# Patient Record
Sex: Male | Born: 1940 | Race: White | Hispanic: No | Marital: Married | Smoking: Never smoker
Health system: Southern US, Community
[De-identification: ages and names within clinical notes are randomized; demographics above are authoritative.]

---

## 2011-06-21 ENCOUNTER — Ambulatory Visit (INDEPENDENT_AMBULATORY_CARE_PROVIDER_SITE_OTHER): Payer: Medicare Other | Admitting: Family Medicine

## 2011-06-21 ENCOUNTER — Encounter: Payer: Self-pay | Admitting: Family Medicine

## 2011-06-21 VITALS — BP 146/79 | HR 76 | Temp 98.0°F | Ht 76.0 in | Wt 210.0 lb

## 2011-06-21 DIAGNOSIS — M25562 Pain in left knee: Secondary | ICD-10-CM

## 2011-06-21 DIAGNOSIS — M25569 Pain in unspecified knee: Secondary | ICD-10-CM

## 2011-06-21 NOTE — Progress Notes (Signed)
  Subjective:    Patient ID: Douglas Lawrence, male    DOB: May 02, 1940, 71 y.o.   MRN: [id number]  PCP: None  HPI 71 yo M here for left knee pain.  Patient denies known injury or trauma. States for past couple weeks has had worsening left knee pain that radiates up thigh at times. Pain worse going up and down ladder and with squatting. Feels some swelling behind knee as well. Tried aspirin with caffeine and tylenol. + popping but no catching, locking, or giving out. Pain bad enough anteriorly to wake him up this morning.  History reviewed. No pertinent past medical history.  No current outpatient prescriptions on file prior to visit.    History reviewed. No pertinent past surgical history.  No Known Allergies  History   Social History  . Marital Status: Married    Spouse Name: N/A    Number of Children: N/A  . Years of Education: N/A   Occupational History  . Not on file.   Social History Main Topics  . Smoking status: Never Smoker   . Smokeless tobacco: Not on file  . Alcohol Use: Not on file  . Drug Use: Not on file  . Sexually Active: Not on file   Other Topics Concern  . Not on file   Social History Narrative  . No narrative on file    Family History  Problem Relation Age of Onset  . Sudden death Neg Hx   . Hypertension Neg Hx   . Hyperlipidemia Neg Hx   . Heart attack Neg Hx   . Diabetes Neg Hx     BP 146/79  Pulse 76  Temp 98 F (36.7 C) (Oral)  Ht 6\' 4"  (1.93 m)  Wt 210 lb (95.255 kg)  BMI 25.56 kg/m2  Review of Systems See HPI above.    Objective:   Physical Exam Gen: NAD  L knee: No gross deformity, ecchymoses, swelling.   Mild medial joint line TTP.  No other TTP about knee. FROM. Negative ant/post drawers. Negative valgus/varus testing. Negative lachmanns. Negative mcmurrays, apleys, patellar apprehension, clarkes. NV intact distally.  R knee: FROM without pain, instability.    Assessment & Plan:  1. 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  Cortisone injection given today.  Discussed other medications to use (tylenol, aleve, glucosamine, capsaicin).  Quad strengthening discussed.  F/u in 1 month for reevaluation.  After informed written consent, patient was seated on exam table. Left knee was prepped with alcohol swab and utilizing anteromedial approach, patient's left knee was injected intraarticularly with 3:1 marcaine: depomedrol. Patient tolerated the procedure well without immediate complications.

## 2011-06-21 NOTE — Patient Instructions (Addendum)
Take tylenol 500mg  1-2 tabs three times a day for pain. Aleve 1-2 tabs twice a day with food Glucosamine sulfate 750mg  twice a day is a supplement that has been shown to help moderate to severe arthritis. Capsaicin topically up to four times a day may also help with pain. Cortisone injections are an option. If cortisone injections do not help, there are different types of shots that may help but they take longer to take effect. It's important that you continue to stay active. Straight leg raises, knee extensions to help with quad strengthening - 3 sets of 10 once a day - add ankle weight if these become too easy. Heat or ice 15 minutes at a time 3-4 times a day as needed to help with pain. Water aerobics and cycling with low resistance are the best two types of exercise for arthritis. Follow up with me in 1 month for reevaluation.

## 2011-06-23 ENCOUNTER — Encounter: Payer: Self-pay | Admitting: Family Medicine

## 2011-06-23 NOTE — Assessment & Plan Note (Signed)
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  Cortisone injection given today.  Discussed other medications to use (tylenol, aleve, glucosamine, capsaicin).  Quad strengthening discussed.  F/u in 1 month for reevaluation.  After informed written consent, patient was seated on exam table. Left knee was prepped with alcohol swab and utilizing anteromedial approach, patient's left knee was injected intraarticularly with 3:1 marcaine: depomedrol. Patient tolerated the procedure well without immediate complications.

## 2011-10-18 DIAGNOSIS — Z23 Encounter for immunization: Secondary | ICD-10-CM | POA: Diagnosis not present

## 2012-05-05 DIAGNOSIS — H2510 Age-related nuclear cataract, unspecified eye: Secondary | ICD-10-CM | POA: Diagnosis not present

## 2012-05-05 DIAGNOSIS — H0230 Blepharochalasis unspecified eye, unspecified eyelid: Secondary | ICD-10-CM | POA: Diagnosis not present

## 2012-10-20 DIAGNOSIS — Z23 Encounter for immunization: Secondary | ICD-10-CM | POA: Diagnosis not present

## 2013-07-30 DIAGNOSIS — H18419 Arcus senilis, unspecified eye: Secondary | ICD-10-CM | POA: Diagnosis not present

## 2013-07-30 DIAGNOSIS — H25019 Cortical age-related cataract, unspecified eye: Secondary | ICD-10-CM | POA: Diagnosis not present

## 2013-07-30 DIAGNOSIS — H25049 Posterior subcapsular polar age-related cataract, unspecified eye: Secondary | ICD-10-CM | POA: Diagnosis not present

## 2013-07-30 DIAGNOSIS — H02839 Dermatochalasis of unspecified eye, unspecified eyelid: Secondary | ICD-10-CM | POA: Diagnosis not present

## 2013-07-30 DIAGNOSIS — H2510 Age-related nuclear cataract, unspecified eye: Secondary | ICD-10-CM | POA: Diagnosis not present

## 2013-08-19 ENCOUNTER — Encounter: Payer: Self-pay | Admitting: Family Medicine

## 2013-08-19 ENCOUNTER — Ambulatory Visit (INDEPENDENT_AMBULATORY_CARE_PROVIDER_SITE_OTHER): Payer: Medicare Other | Admitting: Family Medicine

## 2013-08-19 VITALS — BP 160/94 | HR 90 | Ht 76.0 in | Wt 210.0 lb

## 2013-08-19 DIAGNOSIS — M25569 Pain in unspecified knee: Secondary | ICD-10-CM | POA: Diagnosis not present

## 2013-08-19 DIAGNOSIS — M25561 Pain in right knee: Secondary | ICD-10-CM

## 2013-08-19 MED ORDER — METHYLPREDNISOLONE ACETATE 40 MG/ML IJ SUSP
40.0000 mg | Freq: Once | INTRAMUSCULAR | Status: AC
  Administered 2013-08-19: 40 mg via INTRA_ARTICULAR

## 2013-08-19 NOTE — Patient Instructions (Signed)
You have arthritis.  Below are the different things you can do for this: Take tylenol 500mg  1-2 tabs three times a day for pain. Aleve 1-2 tabs twice a day with food Glucosamine sulfate 750mg  twice a day is a supplement that may help. Capsaicin topically up to four times a day may also help with pain. Cortisone injections are an option - you were given one of these today. If cortisone injections do not help, there are different types of shots that may help but they take longer to take effect. It's important that you continue to stay active. Straight leg raises, knee extensions 3 sets of 10 once a day (add ankle weight if these become too easy). Consider physical therapy to strengthen muscles around the joint that hurts to take pressure off of the joint itself. Shoe inserts with good arch support may be helpful. Heat or ice 15 minutes at a time 3-4 times a day as needed to help with pain. Water aerobics and cycling with low resistance are the best two types of exercise for arthritis. Follow up with me as needed.

## 2013-08-20 ENCOUNTER — Encounter: Payer: Self-pay | Admitting: Family Medicine

## 2013-08-20 DIAGNOSIS — M25561 Pain in right knee: Secondary | ICD-10-CM | POA: Insufficient documentation

## 2013-08-20 NOTE — Assessment & Plan Note (Signed)
2/2 arthritis.  Discussed tylenol, nsaids, glucosamine, capsaicin.  Injection given today.  Home exercises reviewed.  Consider PT in future.  F/u prn.  After informed written consent, patient was seated on exam table. Right knee was prepped with alcohol swab and utilizing anteromedial approach, patient's right knee was injected intraarticularly with 3:1 marcaine: depomedrol. Patient tolerated the procedure well without immediate complications.

## 2013-08-20 NOTE — Progress Notes (Signed)
Patient ID: Douglas Lawrence, male   DOB: 1940-02-10, 73 y.o.   MRN: [id number]  PCP: No primary provider on file.  Subjective:   HPI: Patient is a 73 y.o. male here for right knee pain.  Patient denies known injury He states over past 2 months pain on medial aspect of right knee has been worsening. Last visit 2 years ago was for left knee - did well following injection, no pain. No swelling, bruising. Taking aleve and occasional tylenol. By end of day pain is 7/10. No catching, locking, giving out.   History reviewed. No pertinent past medical history.  No current outpatient prescriptions on file prior to visit.   No current facility-administered medications on file prior to visit.    History reviewed. No pertinent past surgical history.  No Known Allergies  History   Social History  . Marital Status: Married    Spouse Name: N/A    Number of Children: N/A  . Years of Education: N/A   Occupational History  . Not on file.   Social History Main Topics  . Smoking status: Never Smoker   . Smokeless tobacco: Not on file  . Alcohol Use: Not on file  . Drug Use: Not on file  . Sexual Activity: Not on file   Other Topics Concern  . Not on file   Social History Narrative  . No narrative on file    Family History  Problem Relation Age of Onset  . Sudden death Neg Hx   . Hypertension Neg Hx   . Hyperlipidemia Neg Hx   . Heart attack Neg Hx   . Diabetes Neg Hx     BP 160/94  Pulse 90  Ht 6\' 4"  (1.93 m)  Wt 210 lb (95.255 kg)  BMI 25.57 kg/m2  Review of Systems: See HPI above.    Objective:  Physical Exam:  Gen: NAD  Right knee: No gross deformity, ecchymoses, swelling.  Crepitation. TTP medial joint line.  No other tenderness. FROM. Negative ant/post drawers. Negative valgus/varus testing. Negative lachmanns. Negative mcmurrays, apleys, patellar apprehension, clarkes. NV intact distally.    Assessment & Plan:  1. Right knee pain - 2/2 arthritis.   Discussed tylenol, nsaids, glucosamine, capsaicin.  Injection given today.  Home exercises reviewed.  Consider PT in future.  F/u prn.  After informed written consent, patient was seated on exam table. Right knee was prepped with alcohol swab and utilizing anteromedial approach, patient's right knee was injected intraarticularly with 3:1 marcaine: depomedrol. Patient tolerated the procedure well without immediate complications.

## 2013-09-10 DIAGNOSIS — H269 Unspecified cataract: Secondary | ICD-10-CM | POA: Insufficient documentation

## 2013-09-16 DIAGNOSIS — Z791 Long term (current) use of non-steroidal anti-inflammatories (NSAID): Secondary | ICD-10-CM | POA: Diagnosis not present

## 2013-09-16 DIAGNOSIS — IMO0002 Reserved for concepts with insufficient information to code with codable children: Secondary | ICD-10-CM | POA: Diagnosis not present

## 2013-09-16 DIAGNOSIS — H269 Unspecified cataract: Secondary | ICD-10-CM | POA: Diagnosis not present

## 2013-09-16 DIAGNOSIS — H2510 Age-related nuclear cataract, unspecified eye: Secondary | ICD-10-CM | POA: Diagnosis not present

## 2013-09-16 DIAGNOSIS — I10 Essential (primary) hypertension: Secondary | ICD-10-CM | POA: Diagnosis not present

## 2013-09-17 DIAGNOSIS — H2510 Age-related nuclear cataract, unspecified eye: Secondary | ICD-10-CM | POA: Diagnosis not present

## 2013-10-01 DIAGNOSIS — H269 Unspecified cataract: Secondary | ICD-10-CM | POA: Insufficient documentation

## 2013-10-06 DIAGNOSIS — H6120 Impacted cerumen, unspecified ear: Secondary | ICD-10-CM | POA: Diagnosis not present

## 2013-10-06 DIAGNOSIS — H903 Sensorineural hearing loss, bilateral: Secondary | ICD-10-CM | POA: Diagnosis not present

## 2013-10-07 DIAGNOSIS — H2589 Other age-related cataract: Secondary | ICD-10-CM | POA: Diagnosis not present

## 2013-10-07 DIAGNOSIS — H02839 Dermatochalasis of unspecified eye, unspecified eyelid: Secondary | ICD-10-CM | POA: Diagnosis not present

## 2013-10-07 DIAGNOSIS — Z98811 Dental restoration status: Secondary | ICD-10-CM | POA: Diagnosis not present

## 2013-10-07 DIAGNOSIS — I10 Essential (primary) hypertension: Secondary | ICD-10-CM | POA: Diagnosis not present

## 2013-10-07 DIAGNOSIS — H18419 Arcus senilis, unspecified eye: Secondary | ICD-10-CM | POA: Diagnosis not present

## 2013-10-07 DIAGNOSIS — H2510 Age-related nuclear cataract, unspecified eye: Secondary | ICD-10-CM | POA: Diagnosis not present

## 2013-10-16 DIAGNOSIS — Z23 Encounter for immunization: Secondary | ICD-10-CM | POA: Diagnosis not present

## 2014-05-20 DIAGNOSIS — H02839 Dermatochalasis of unspecified eye, unspecified eyelid: Secondary | ICD-10-CM | POA: Diagnosis not present

## 2014-05-20 DIAGNOSIS — H18411 Arcus senilis, right eye: Secondary | ICD-10-CM | POA: Diagnosis not present

## 2014-05-20 DIAGNOSIS — Z961 Presence of intraocular lens: Secondary | ICD-10-CM | POA: Diagnosis not present

## 2014-09-28 ENCOUNTER — Ambulatory Visit (INDEPENDENT_AMBULATORY_CARE_PROVIDER_SITE_OTHER): Payer: Medicare Other | Admitting: Family Medicine

## 2014-09-28 ENCOUNTER — Encounter: Payer: Self-pay | Admitting: Family Medicine

## 2014-09-28 ENCOUNTER — Encounter (INDEPENDENT_AMBULATORY_CARE_PROVIDER_SITE_OTHER): Payer: Self-pay

## 2014-09-28 VITALS — BP 189/77 | HR 85 | Ht 75.0 in | Wt 220.0 lb

## 2014-09-28 DIAGNOSIS — M25569 Pain in unspecified knee: Secondary | ICD-10-CM | POA: Diagnosis present

## 2014-09-28 DIAGNOSIS — M25551 Pain in right hip: Secondary | ICD-10-CM | POA: Diagnosis not present

## 2014-09-28 MED ORDER — PREDNISONE 10 MG PO TABS: ORAL_TABLET | ORAL | Status: DC

## 2014-09-28 NOTE — Patient Instructions (Signed)
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 Pick 2-3 stretches where you feel the pull in the area of pain - do 3 of these and hold for 20-30 seconds at least once a day Standing hip rotations and side leg raises 3 sets of 10 once a day Add ankle weight if these become too easy. Prednisone dose pack x 6 days. Consider tennis ball to massage area when sitting Consider physical therapy Follow up with me in 6 weeks.

## 2014-09-30 DIAGNOSIS — M5410 Radiculopathy, site unspecified: Secondary | ICD-10-CM | POA: Insufficient documentation

## 2014-09-30 NOTE — Assessment & Plan Note (Signed)
consistent with sciatica, piriformis strain/spasms.  Shown home exercises and stretches to do daily.  Prednisone dose pack (was only taking very low dose) for 6 days.  Consider physical therapy if not improving.  F/u in 6 weeks.
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
## 2014-10-28 ENCOUNTER — Ambulatory Visit (INDEPENDENT_AMBULATORY_CARE_PROVIDER_SITE_OTHER): Payer: Medicare Other | Admitting: Family Medicine

## 2014-10-28 ENCOUNTER — Ambulatory Visit (HOSPITAL_BASED_OUTPATIENT_CLINIC_OR_DEPARTMENT_OTHER)
Admission: RE | Admit: 2014-10-28 | Discharge: 2014-10-28 | Disposition: A | Discharge status: Home / Self Care | Payer: Medicare Other | Source: Ambulatory Visit | Attending: Family Medicine | Admitting: Family Medicine

## 2014-10-28 ENCOUNTER — Encounter (INDEPENDENT_AMBULATORY_CARE_PROVIDER_SITE_OTHER): Payer: Self-pay

## 2014-10-28 DIAGNOSIS — M545 Low back pain: Secondary | ICD-10-CM | POA: Diagnosis not present

## 2014-10-28 DIAGNOSIS — M25551 Pain in right hip: Secondary | ICD-10-CM | POA: Diagnosis not present

## 2014-10-28 DIAGNOSIS — M4186 Other forms of scoliosis, lumbar region: Secondary | ICD-10-CM | POA: Diagnosis not present

## 2014-10-28 DIAGNOSIS — M47896 Other spondylosis, lumbar region: Secondary | ICD-10-CM | POA: Insufficient documentation

## 2014-10-28 DIAGNOSIS — M5441 Lumbago with sciatica, right side: Secondary | ICD-10-CM | POA: Diagnosis not present

## 2014-10-28 DIAGNOSIS — M5410 Radiculopathy, site unspecified: Secondary | ICD-10-CM | POA: Diagnosis not present

## 2014-10-28 NOTE — Patient Instructions (Signed)
Get x-rays as you leave today. We will set up an MRI of your lumbar spine assuming the x-rays only show arthritis as expected.

## 2014-11-01 ENCOUNTER — Other Ambulatory Visit: Payer: Self-pay | Admitting: Family Medicine

## 2014-11-01 DIAGNOSIS — Z1389 Encounter for screening for other disorder: Secondary | ICD-10-CM

## 2014-11-01 NOTE — Assessment & Plan Note (Signed)
concerning for radiculopathy from disc bulge/herniation.  Radiographs with mild DDD only.  Will go ahead with MRI to further assess as not improving with prednisone, home exercises.
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
## 2014-11-02 ENCOUNTER — Ambulatory Visit (HOSPITAL_BASED_OUTPATIENT_CLINIC_OR_DEPARTMENT_OTHER)
Admission: RE | Admit: 2014-11-02 | Discharge: 2014-11-02 | Disposition: A | Discharge status: Home / Self Care | Payer: Medicare Other | Source: Ambulatory Visit | Attending: Family Medicine | Admitting: Family Medicine

## 2014-11-02 DIAGNOSIS — Z23 Encounter for immunization: Secondary | ICD-10-CM | POA: Diagnosis not present

## 2014-11-02 DIAGNOSIS — M795 Residual foreign body in soft tissue: Secondary | ICD-10-CM | POA: Diagnosis not present

## 2014-11-02 DIAGNOSIS — Z1389 Encounter for screening for other disorder: Secondary | ICD-10-CM | POA: Diagnosis not present

## 2014-11-06 ENCOUNTER — Ambulatory Visit (HOSPITAL_BASED_OUTPATIENT_CLINIC_OR_DEPARTMENT_OTHER)
Admission: RE | Admit: 2014-11-06 | Discharge: 2014-11-06 | Disposition: A | Discharge status: Home / Self Care | Payer: Medicare Other | Source: Ambulatory Visit | Attending: Family Medicine | Admitting: Family Medicine

## 2014-11-06 DIAGNOSIS — M5126 Other intervertebral disc displacement, lumbar region: Secondary | ICD-10-CM | POA: Insufficient documentation

## 2014-11-06 DIAGNOSIS — M4806 Spinal stenosis, lumbar region: Secondary | ICD-10-CM | POA: Diagnosis not present

## 2014-11-06 DIAGNOSIS — M5441 Lumbago with sciatica, right side: Secondary | ICD-10-CM

## 2014-11-06 DIAGNOSIS — M545 Low back pain: Secondary | ICD-10-CM | POA: Diagnosis present

## 2014-11-06 DIAGNOSIS — M5431 Sciatica, right side: Secondary | ICD-10-CM | POA: Insufficient documentation

## 2014-11-06 DIAGNOSIS — M5127 Other intervertebral disc displacement, lumbosacral region: Secondary | ICD-10-CM | POA: Diagnosis not present

## 2014-11-06 DIAGNOSIS — M1288 Other specific arthropathies, not elsewhere classified, other specified site: Secondary | ICD-10-CM | POA: Insufficient documentation

## 2014-11-06 DIAGNOSIS — M7138 Other bursal cyst, other site: Secondary | ICD-10-CM | POA: Diagnosis not present

## 2014-11-10 ENCOUNTER — Ambulatory Visit: Payer: No Typology Code available for payment source | Admitting: Family Medicine

## 2014-11-10 ENCOUNTER — Other Ambulatory Visit: Payer: Self-pay | Admitting: Family Medicine

## 2014-11-10 DIAGNOSIS — M545 Low back pain, unspecified: Secondary | ICD-10-CM

## 2014-11-10 DIAGNOSIS — G8929 Other chronic pain: Secondary | ICD-10-CM

## 2014-11-10 DIAGNOSIS — M5431 Sciatica, right side: Secondary | ICD-10-CM

## 2014-11-17 ENCOUNTER — Other Ambulatory Visit: Payer: No Typology Code available for payment source

## 2014-11-18 ENCOUNTER — Other Ambulatory Visit: Payer: Self-pay | Admitting: Family Medicine

## 2014-11-18 ENCOUNTER — Ambulatory Visit
Admission: RE | Admit: 2014-11-18 | Discharge: 2014-11-18 | Disposition: A | Discharge status: Home / Self Care | Payer: Medicare Other | Source: Ambulatory Visit | Attending: Family Medicine | Admitting: Family Medicine

## 2014-11-18 DIAGNOSIS — M545 Low back pain: Principal | ICD-10-CM

## 2014-11-18 DIAGNOSIS — M5431 Sciatica, right side: Secondary | ICD-10-CM

## 2014-11-18 DIAGNOSIS — G8929 Other chronic pain: Secondary | ICD-10-CM

## 2014-11-18 DIAGNOSIS — M7138 Other bursal cyst, other site: Secondary | ICD-10-CM | POA: Diagnosis not present

## 2014-11-18 MED ORDER — IOHEXOL 180 MG/ML  SOLN
1.0000 mL | Freq: Once | INTRAMUSCULAR | Status: DC | PRN
  Administered 2014-11-18: 1 mL via EPIDURAL

## 2014-11-18 MED ORDER — METHYLPREDNISOLONE ACETATE 40 MG/ML INJ SUSP (RADIOLOG
120.0000 mg | Freq: Once | INTRAMUSCULAR | Status: AC
  Administered 2014-11-18: 120 mg via EPIDURAL

## 2014-11-18 NOTE — Discharge Instructions (Signed)

## 2015-05-05 ENCOUNTER — Ambulatory Visit (INDEPENDENT_AMBULATORY_CARE_PROVIDER_SITE_OTHER): Payer: Medicare Other | Admitting: Family Medicine

## 2015-05-05 ENCOUNTER — Encounter: Payer: Self-pay | Admitting: Family Medicine

## 2015-05-05 VITALS — BP 136/78 | HR 84 | Temp 97.8°F | Ht 76.0 in | Wt 215.0 lb

## 2015-05-05 DIAGNOSIS — M10072 Idiopathic gout, left ankle and foot: Secondary | ICD-10-CM

## 2015-05-05 DIAGNOSIS — M109 Gout, unspecified: Secondary | ICD-10-CM

## 2015-05-05 MED ORDER — PREDNISONE 10 MG PO TABS: ORAL_TABLET | ORAL | Status: DC

## 2015-05-05 MED ORDER — PREDNISONE 10 MG PO TABS: ORAL_TABLET | ORAL | Status: AC

## 2015-05-05 MED ORDER — COLCHICINE 0.6 MG PO TABS: 0.6000 mg | ORAL_TABLET | Freq: Two times a day (BID) | ORAL | Status: DC

## 2015-05-05 MED ORDER — COLCHICINE 0.6 MG PO TABS: 0.6000 mg | ORAL_TABLET | Freq: Two times a day (BID) | ORAL | Status: AC

## 2015-05-05 MED FILL — predniSONE 10 MG TABS: 10 | 12 days supply | Qty: 42 | Fill #0

## 2015-05-05 NOTE — Patient Instructions (Signed)
You have an acute flare of gout. Take prednisone as directed for 12 days. Take colchicine twice a day until pain has resolved. Icing 15 minutes at a time 3-4 times a day. Elevate as needed for swelling. Ok to walk with this - if you're struggling consider a boot or postop shoe. Follow up with me in 2 weeks. Can consider a joint injection also though typically don't need this.

## 2015-05-06 DIAGNOSIS — M109 Gout, unspecified: Secondary | ICD-10-CM | POA: Insufficient documentation

## 2015-05-06 NOTE — Assessment & Plan Note (Signed)
classic presentation at 1st MTP.  No prior issues.  Afebrile. Brief MSK u/s confirmed effusion and gouty crystals visualized in this joint.  Discussed options - he would like to try oral prednisone dose pack (will do extended course).  Colchicine prescribed but too expensive.  Icing, elevation.  Ambulation as tolerated.  Consider intraarticular injection if not improving over next 1-2 weeks.  F/u in 2 weeks.

## 2015-05-06 NOTE — Progress Notes (Signed)
PCP: No primary care provider on file.  Subjective:   HPI: Patient is a 75 y.o. male here for left great toe pain.  Patient denies known injury or trauma. He reports increased pain, swelling, redness at base of left great toe for 2 weeks. Took some of his wife's prednisone which helped some but pain returned. Pain level now 6-7/10 Pain can radiate up to calf. Worse with any movement.  No past medical history on file.  Current Outpatient Prescriptions on File Prior to Visit  Medication Sig Dispense Refill  . BESIVANCE 0.6 % SUSP     . DUREZOL 0.05 % EMUL     . ILEVRO 0.3 % SUSP      No current facility-administered medications on file prior to visit.    No past surgical history on file.  No Known Allergies  Social History   Social History  . Marital Status: Married    Spouse Name: N/A  . Number of Children: N/A  . Years of Education: N/A   Occupational History  . Not on file.   Social History Main Topics  . Smoking status: Never Smoker   . Smokeless tobacco: Not on file  . Alcohol Use: Not on file  . Drug Use: Not on file  . Sexual Activity: Not on file   Other Topics Concern  . Not on file   Social History Narrative    Family History  Problem Relation Age of Onset  . Sudden death Neg Hx   . Hypertension Neg Hx   . Hyperlipidemia Neg Hx   . Heart attack Neg Hx   . Diabetes Neg Hx     BP 136/78 mmHg  Pulse 84  Temp(Src) 97.8 F (36.6 C) (Oral)  Ht 6\' 4"  (1.93 m)  Wt 215 lb (97.523 kg)  BMI 26.18 kg/m2  Review of Systems: See HPI above.    Objective:  Physical Exam:  Gen: NAD, comfortable in exam room  Left foot/ankle: Swelling, warmth, redness over 1st MTP.  No other deformity.  No bruising. Decreased motion of 1st MTP - very painful with any movement. TTP 1st MTP. Negative ant drawer and talar tilt.   Negative syndesmotic compression. Thompsons test negative. NV intact distally.    Assessment & Plan:  1. Acute gout flare - classic  presentation at 1st MTP.  No prior issues.  Afebrile. Brief MSK u/s confirmed effusion and gouty crystals visualized in this joint.  Discussed options - he would like to try oral prednisone dose pack (will do extended course).  Colchicine prescribed but too expensive.  Icing, elevation.  Ambulation as tolerated.  Consider intraarticular injection if not improving over next 1-2 weeks.  F/u in 2 weeks.

## 2015-05-23 ENCOUNTER — Ambulatory Visit (INDEPENDENT_AMBULATORY_CARE_PROVIDER_SITE_OTHER): Payer: Medicare Other | Admitting: Family Medicine

## 2015-05-23 ENCOUNTER — Encounter: Payer: Self-pay | Admitting: Family Medicine

## 2015-05-23 VITALS — BP 146/76 | HR 73 | Ht 76.0 in | Wt 220.0 lb

## 2015-05-23 DIAGNOSIS — M10072 Idiopathic gout, left ankle and foot: Secondary | ICD-10-CM

## 2015-05-23 DIAGNOSIS — M109 Gout, unspecified: Secondary | ICD-10-CM

## 2015-05-24 NOTE — Progress Notes (Signed)
PCP: No PCP Per Patient  Subjective:   HPI: Patient is a 75 y.o. male here for left great toe pain.  4/27: Patient denies known injury or trauma. He reports increased pain, swelling, redness at base of left great toe for 2 weeks. Took some of his wife's prednisone which helped some but pain returned. Pain level now 6-7/10 Pain can radiate up to calf. Worse with any movement.  5/15: Patient reports he feels much better. Has finished the prednisone, still with a little stiffness but no pain. Pain 0/10. No skin changes. Redness has resolved. No fever.  No past medical history on file.  Current Outpatient Prescriptions on File Prior to Visit  Medication Sig Dispense Refill  . BESIVANCE 0.6 % SUSP     . colchicine 0.6 MG tablet Take 1 tablet (0.6 mg total) by mouth 2 (two) times daily. 60 tablet 1  . DUREZOL 0.05 % EMUL     . ILEVRO 0.3 % SUSP     . predniSONE (DELTASONE) 10 MG tablet 6 tabs po days 1-2, 5 tabs po days 3-4, 4 tabs po days 5-6, 3 tabs po days 7-8, 2 tabs po days 9-10, 1 tab po days 11-12 42 tablet 0   No current facility-administered medications on file prior to visit.    No past surgical history on file.  No Known Allergies  Social History   Social History  . Marital Status: Married    Spouse Name: N/A  . Number of Children: N/A  . Years of Education: N/A   Occupational History  . Not on file.   Social History Main Topics  . Smoking status: Never Smoker   . Smokeless tobacco: Not on file  . Alcohol Use: Not on file  . Drug Use: Not on file  . Sexual Activity: Not on file   Other Topics Concern  . Not on file   Social History Narrative    Family History  Problem Relation Age of Onset  . Sudden death Neg Hx   . Hypertension Neg Hx   . Hyperlipidemia Neg Hx   . Heart attack Neg Hx   . Diabetes Neg Hx     BP 146/76 mmHg  Pulse 73  Ht 6\' 4"  (1.93 m)  Wt 220 lb (99.791 kg)  BMI 26.79 kg/m2  Review of Systems: See HPI above.     Objective:  Physical Exam:  Gen: NAD, comfortable in exam room  Left foot/ankle: No swelling, warmth, redness over 1st MTP now.  No deformity.  No bruising. Mild decreased motion at 1st MTP but no pain. No TTP 1st MTP. Negative ant drawer and talar tilt.   Negative syndesmotic compression. Thompsons test negative. NV intact distally.    Assessment & Plan:  1. Acute gout flare - classic presentation at 1st MTP.  Clinically improved following prednisone dose pack.  Reports this is only time he has had this - will not start preventative medicine as such or check uric acid level.  F/u with us prn.

## 2015-05-24 NOTE — Assessment & Plan Note (Signed)
classic presentation at 1st MTP.  Clinically improved following prednisone dose pack.  Reports this is only time he has had this - will not start preventative medicine as such or check uric acid level.  F/u with us prn.

## 2015-11-15 DIAGNOSIS — Z23 Encounter for immunization: Secondary | ICD-10-CM | POA: Diagnosis not present

## 2016-07-25 IMAGING — MR MR LUMBAR SPINE W/O CM
5 series · 31 of 48 positions shown · non-contrast
Comparison: Lumbar radiographs 10/28/2014.

CLINICAL DATA: Low back pain.  RIGHT-sided sciatica.

EXAM:
MRI LUMBAR SPINE WITHOUT CONTRAST
TECHNIQUE: Multiplanar, multisequence MR imaging of the lumbar spine was
performed. No intravenous contrast was administered.

[Series 2: T1 · sagittal · 4.0mm · 0.51mm/px · 6 of 17 slices shown (1 of 2)]
[im 1/17]
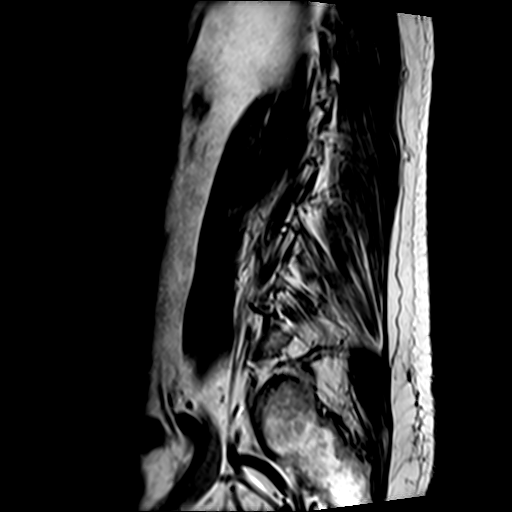
[im 4/17]
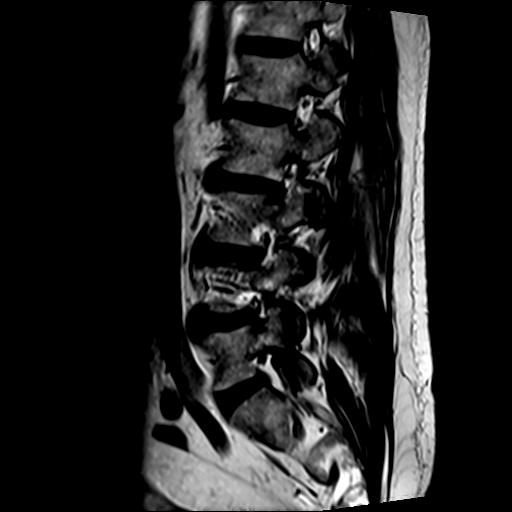
[im 7/17]
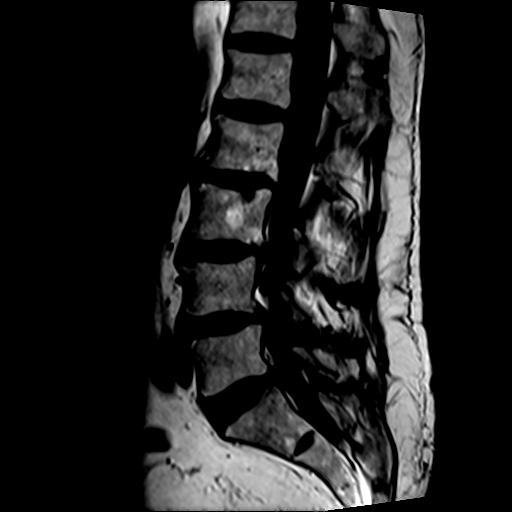
[im 10/17]
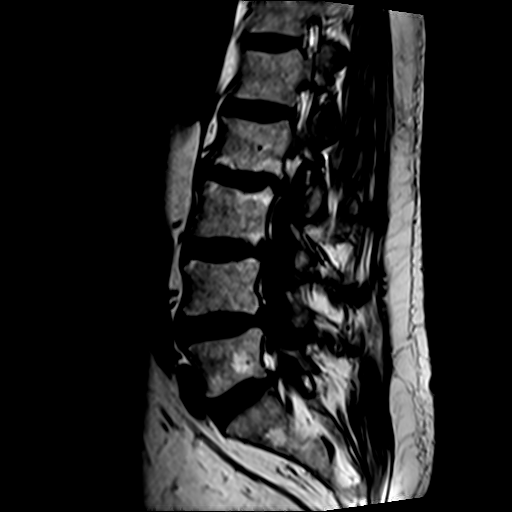
[im 13/17]
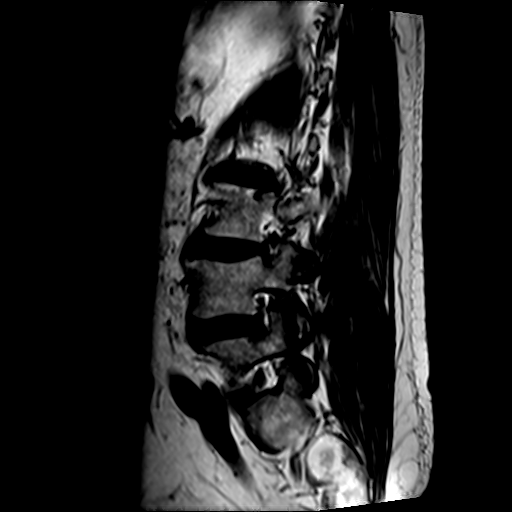
[im 17/17]
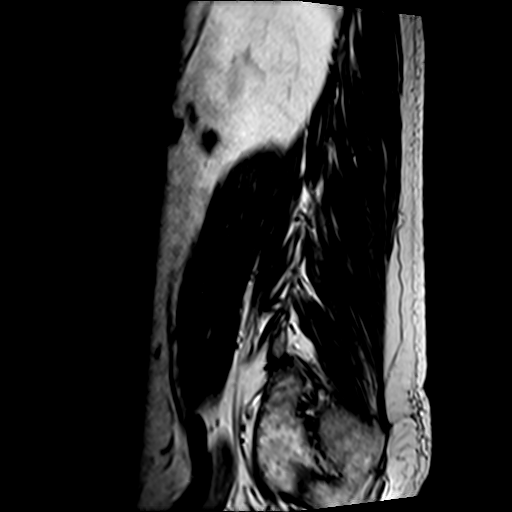

[Series 3: T2 · sagittal · 4.0mm · 0.81mm/px · 7 of 17 slices shown (1 of 2)]
[im 1/17]
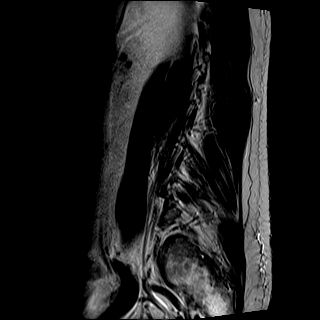
[im 3/17]
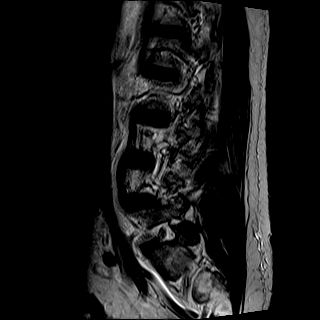
[im 6/17]
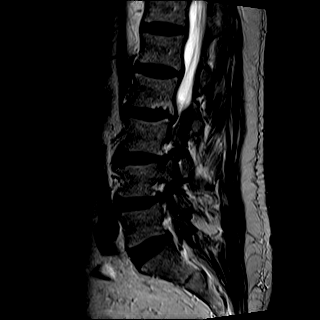
[im 9/17]
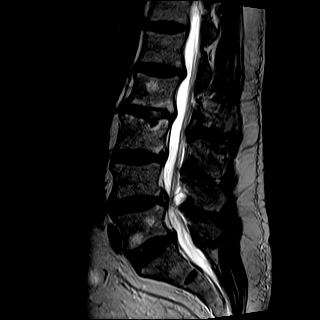
[im 11/17]
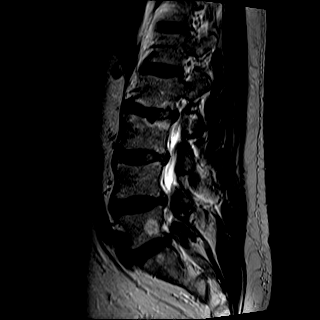
[im 14/17]
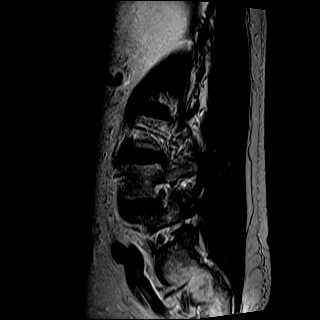
[im 17/17]
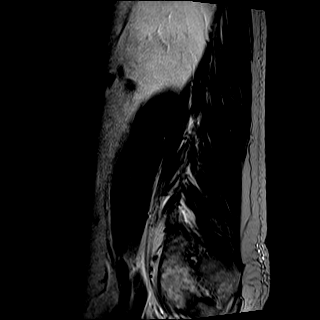

[Series 4: STIR · sagittal · 4.0mm · 1.02mm/px · 2 of 17 slices shown]
[im 1/17]
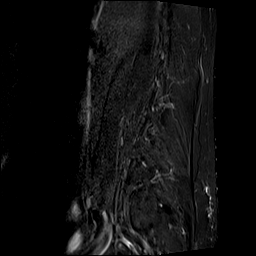
[im 3/17]
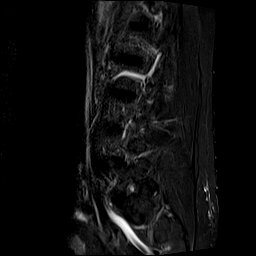

[Series 5: T2 · axial · 4.0mm · 0.39mm/px · z∈[-46,+158]mm · 8 of 36 slices shown (2 of 2)]
[im 1/36]
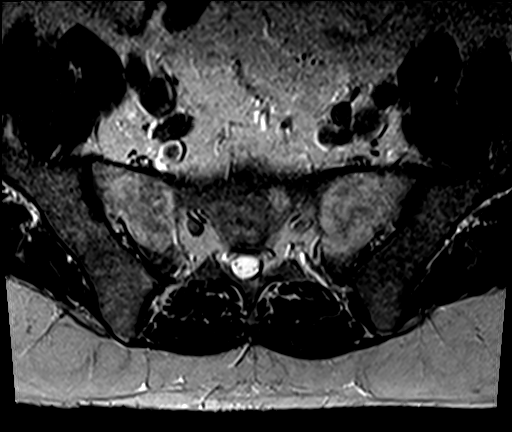
[im 6/36]
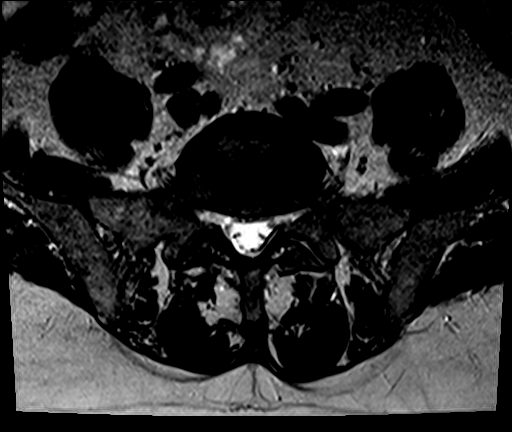
[im 11/36]
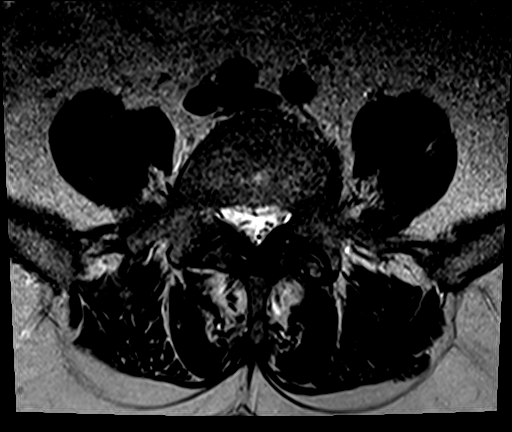
[im 17/36]
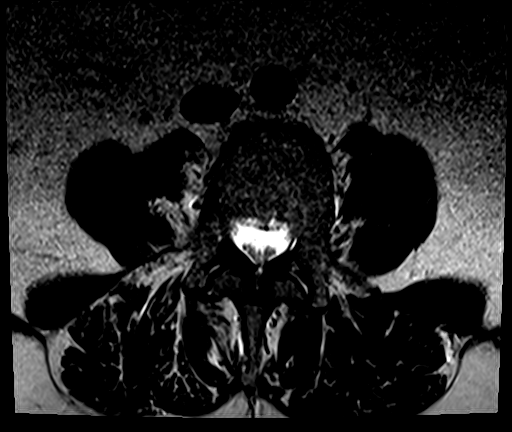
[im 19/36]
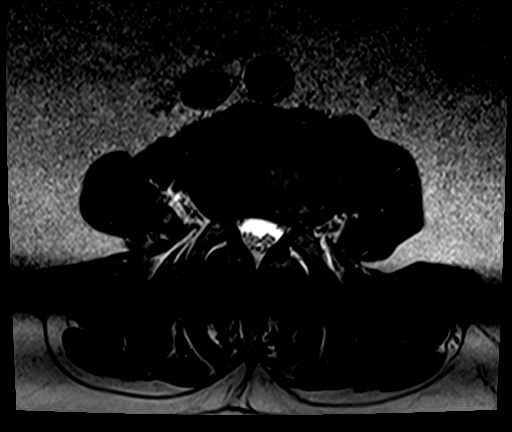
[im 25/36]
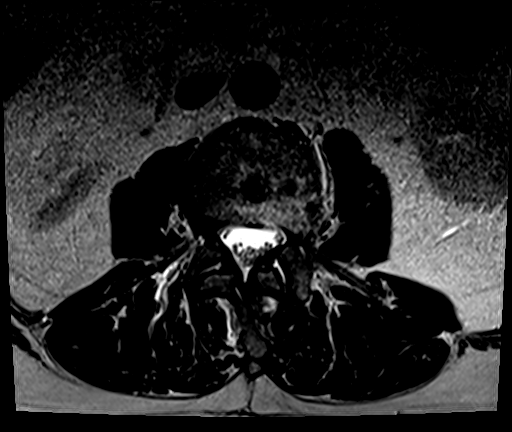
[im 30/36]
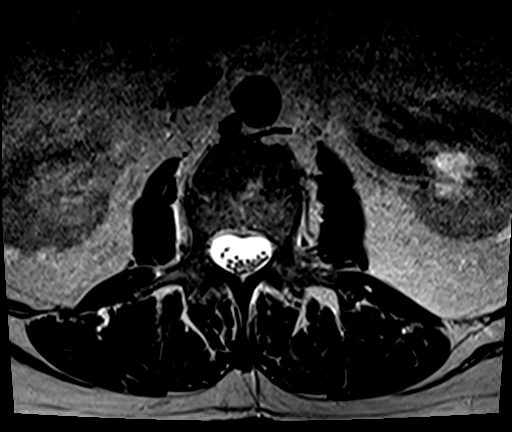
[im 36/36]
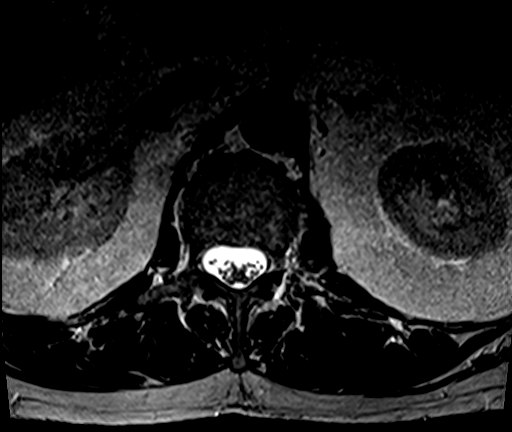

[Series 6: T1 · axial · 4.0mm · 0.78mm/px · z∈[-46,+158]mm · 8 of 36 slices shown (2 of 2)]
[im 1/36]
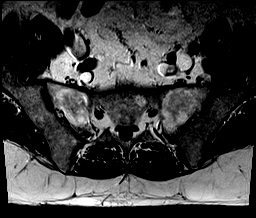
[im 6/36]
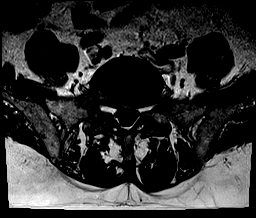
[im 11/36]
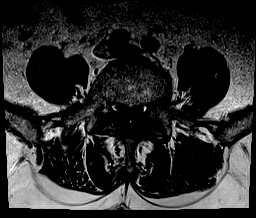
[im 17/36]
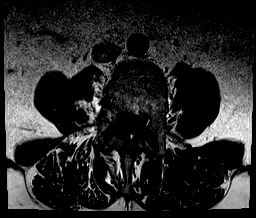
[im 19/36]
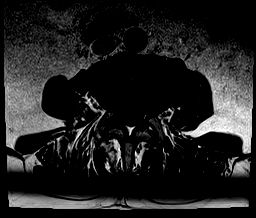
[im 25/36]
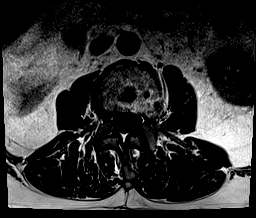
[im 30/36]
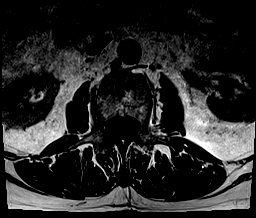
[im 36/36]
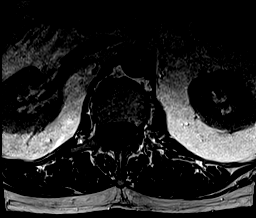

[31 of 48 positions shown; findings below may reference images not displayed]

FINDINGS: Segmentation: Normal.

Alignment: No subluxation. Minimal degenerative scoliosis convex
LEFT in the lower lumbar region related to asymmetric loss of
interspace height at L4-5 on the RIGHT.

Vertebrae: No worrisome osseous lesion.Minor endplate changes
related to degenerative disc disease and Schmorl's nodes.

Conus medullaris: Normal in size, signal, and location.

Paraspinal tissues: No evidence for hydronephrosis or paravertebral
mass.

Disc levels:

L1-L2:  Shallow central and leftward protrusion.  No impingement.

L2-L3:  Mild bulge.  Mild facet arthropathy.  No impingement.

L3-L4: Mild bulge centrally. Small foraminal protrusion on the
RIGHT. Moderate posterior element hypertrophy. No subarticular zone
narrowing, but RIGHT L3 nerve root impingement is likely due to
foraminal narrowing.

L4-L5: Disc space narrowing is worse on the RIGHT. There is a
broad-based central protrusion with disc material extending more to
the RIGHT than the LEFT into the foramen. There is moderate facet
arthropathy with a large synovial cyst emanating from the medial
aspect of the RIGHT L4-5 facet joint. This contributes to severe
spinal stenosis. The predominant compressive abnormality relates to
impingement on the RIGHT L5 nerve root by the cyst, although
foraminal narrowing could irritate the RIGHT L4 nerve. The synovial
cyst measures 6 x 14 x 11 mm. (R-L x A-P x C-C).

L5-S1: Shallow central and leftward protrusion. Mild facet
arthropathy. LEFT subarticular zone narrowing could irritate the
LEFT S1 nerve root.
IMPRESSION: The dominant RIGHT-sided abnormality is at L4-5, where large
synovial cyst measuring 6 x 14 x 11 mm results in severe spinal
stenosis and compression of the RIGHT L5 nerve root. Shallow central
protrusion along with facet arthropathy extending to the RIGHT
neural foramen is contributory, and may further irritate the RIGHT
L4 nerve root in the foraminal zone

Asymmetric foraminal narrowing is also present at L3-4 on the RIGHT
due to combination of bony overgrowth and disc material. RIGHT L3
nerve root impingement is likely.

Shallow central and leftward protrusion at L5-S1. Correlate
clinically for LEFT S1 nerve root compression.

## 2016-10-17 DIAGNOSIS — Z23 Encounter for immunization: Secondary | ICD-10-CM | POA: Diagnosis not present

## 2017-03-21 DIAGNOSIS — H6123 Impacted cerumen, bilateral: Secondary | ICD-10-CM | POA: Diagnosis not present

## 2017-03-21 DIAGNOSIS — J0100 Acute maxillary sinusitis, unspecified: Secondary | ICD-10-CM | POA: Diagnosis not present

## 2017-03-21 DIAGNOSIS — H1033 Unspecified acute conjunctivitis, bilateral: Secondary | ICD-10-CM | POA: Diagnosis not present

## 2017-03-21 DIAGNOSIS — J209 Acute bronchitis, unspecified: Secondary | ICD-10-CM | POA: Diagnosis not present

## 2017-04-23 DIAGNOSIS — H26491 Other secondary cataract, right eye: Secondary | ICD-10-CM | POA: Diagnosis not present

## 2017-04-25 DIAGNOSIS — J0100 Acute maxillary sinusitis, unspecified: Secondary | ICD-10-CM | POA: Diagnosis not present

## 2017-04-25 DIAGNOSIS — J209 Acute bronchitis, unspecified: Secondary | ICD-10-CM | POA: Diagnosis not present

## 2017-07-23 DIAGNOSIS — Z23 Encounter for immunization: Secondary | ICD-10-CM | POA: Diagnosis not present
